# Patient Record
Sex: Female | Born: 1965 | Race: Black or African American | Hispanic: No | Marital: Single | Smoking: Never smoker
Health system: Southern US, Community
[De-identification: ages and names within clinical notes are randomized; demographics above are authoritative.]

---

## 2000-09-03 ENCOUNTER — Emergency Department (HOSPITAL_COMMUNITY): Admission: EM | Admit: 2000-09-03 | Discharge: 2000-09-03 | Payer: Self-pay | Admitting: Emergency Medicine

## 2004-10-29 ENCOUNTER — Emergency Department (HOSPITAL_COMMUNITY): Admission: EM | Admit: 2004-10-29 | Discharge: 2004-10-29 | Payer: Self-pay | Admitting: Family Medicine

## 2006-04-17 ENCOUNTER — Emergency Department (HOSPITAL_COMMUNITY): Admission: EM | Admit: 2006-04-17 | Discharge: 2006-04-17 | Payer: Self-pay | Admitting: Emergency Medicine

## 2006-04-26 ENCOUNTER — Emergency Department (HOSPITAL_COMMUNITY): Admission: EM | Admit: 2006-04-26 | Discharge: 2006-04-26 | Payer: Self-pay | Admitting: Emergency Medicine

## 2008-06-16 ENCOUNTER — Emergency Department (HOSPITAL_COMMUNITY): Admission: EM | Admit: 2008-06-16 | Discharge: 2008-06-16 | Payer: Self-pay | Admitting: Family Medicine

## 2008-09-16 ENCOUNTER — Ambulatory Visit: Payer: Self-pay | Admitting: Internal Medicine

## 2008-09-17 ENCOUNTER — Ambulatory Visit: Payer: Self-pay | Admitting: *Deleted

## 2008-09-26 ENCOUNTER — Ambulatory Visit (HOSPITAL_COMMUNITY): Admission: RE | Admit: 2008-09-26 | Discharge: 2008-09-26 | Payer: Self-pay | Admitting: Family Medicine

## 2008-11-18 ENCOUNTER — Ambulatory Visit: Payer: Self-pay | Admitting: Family Medicine

## 2008-11-18 LAB — CONVERTED CEMR LAB
ALT: 16 units/L (ref 0–35)
BUN: 15 mg/dL (ref 6–23)
Basophils Absolute: 0 10*3/uL (ref 0.0–0.1)
Basophils Relative: 0 % (ref 0–1)
CO2: 23 meq/L (ref 19–32)
Cholesterol: 171 mg/dL (ref 0–200)
Creatinine, Ser: 0.5 mg/dL (ref 0.40–1.20)
Eosinophils Relative: 1 % (ref 0–5)
HCT: 35.5 % — ABNORMAL LOW (ref 36.0–46.0)
HDL: 53 mg/dL (ref >39)
Hemoglobin: 11.1 g/dL — ABNORMAL LOW (ref 12.0–15.0)
Lymphocytes Relative: 49 % — ABNORMAL HIGH (ref 12–46)
MCHC: 31.3 g/dL (ref 30.0–36.0)
Monocytes Absolute: 0.4 10*3/uL (ref 0.1–1.0)
Monocytes Relative: 8 % (ref 3–12)
RDW: 13.9 % (ref 11.5–15.5)
Total Bilirubin: 1.1 mg/dL (ref 0.3–1.2)
Total CHOL/HDL Ratio: 3.2
VLDL: 11 mg/dL (ref 0–40)

## 2009-02-05 ENCOUNTER — Encounter (INDEPENDENT_AMBULATORY_CARE_PROVIDER_SITE_OTHER): Payer: Self-pay | Admitting: Family Medicine

## 2009-02-05 ENCOUNTER — Ambulatory Visit: Payer: Self-pay | Admitting: Family Medicine

## 2009-02-05 LAB — CONVERTED CEMR LAB: Chlamydia, DNA Probe: NEGATIVE

## 2009-02-20 ENCOUNTER — Emergency Department (HOSPITAL_COMMUNITY): Admission: EM | Admit: 2009-02-20 | Discharge: 2009-02-20 | Payer: Self-pay | Admitting: Emergency Medicine

## 2010-04-17 ENCOUNTER — Emergency Department (HOSPITAL_COMMUNITY): Admission: EM | Admit: 2010-04-17 | Discharge: 2010-04-17 | Payer: Self-pay | Admitting: Emergency Medicine

## 2010-07-29 ENCOUNTER — Ambulatory Visit: Payer: Self-pay | Admitting: Family Medicine

## 2010-07-29 LAB — CONVERTED CEMR LAB
BUN: 10 mg/dL (ref 6–23)
Basophils Absolute: 0 10*3/uL (ref 0.0–0.1)
Basophils Relative: 0 % (ref 0–1)
Calcium: 9.7 mg/dL (ref 8.4–10.5)
Creatinine, Ser: 0.63 mg/dL (ref 0.40–1.20)
Eosinophils Absolute: 0.1 10*3/uL (ref 0.0–0.7)
Eosinophils Relative: 2 % (ref 0–5)
HCT: 33.4 % — ABNORMAL LOW (ref 36.0–46.0)
Hemoglobin: 10.8 g/dL — ABNORMAL LOW (ref 12.0–15.0)
MCHC: 32.3 g/dL (ref 30.0–36.0)
MCV: 92.8 fL (ref 78.0–100.0)
Monocytes Absolute: 0.4 10*3/uL (ref 0.1–1.0)
Monocytes Relative: 6 % (ref 3–12)
Neutro Abs: 2.6 10*3/uL (ref 1.7–7.7)
RBC: 3.6 M/uL — ABNORMAL LOW (ref 3.87–5.11)
RDW: 13.1 % (ref 11.5–15.5)

## 2010-08-05 ENCOUNTER — Ambulatory Visit (HOSPITAL_COMMUNITY): Admission: RE | Admit: 2010-08-05 | Discharge: 2010-08-05 | Payer: Self-pay | Admitting: Family Medicine

## 2010-10-07 ENCOUNTER — Ambulatory Visit (HOSPITAL_COMMUNITY): Admission: RE | Admit: 2010-10-07 | Discharge: 2010-10-07 | Payer: Self-pay | Admitting: Family Medicine

## 2010-12-14 IMAGING — CR DG SHOULDER 2+V*L*
3 series · 3 of 3 positions shown · non-contrast
Comparison: 10/07/2010

CLINICAL DATA: Chronic shoulder pain

LEFT SHOULDER - 2+ VIEW

[w shoulder ap internal left]
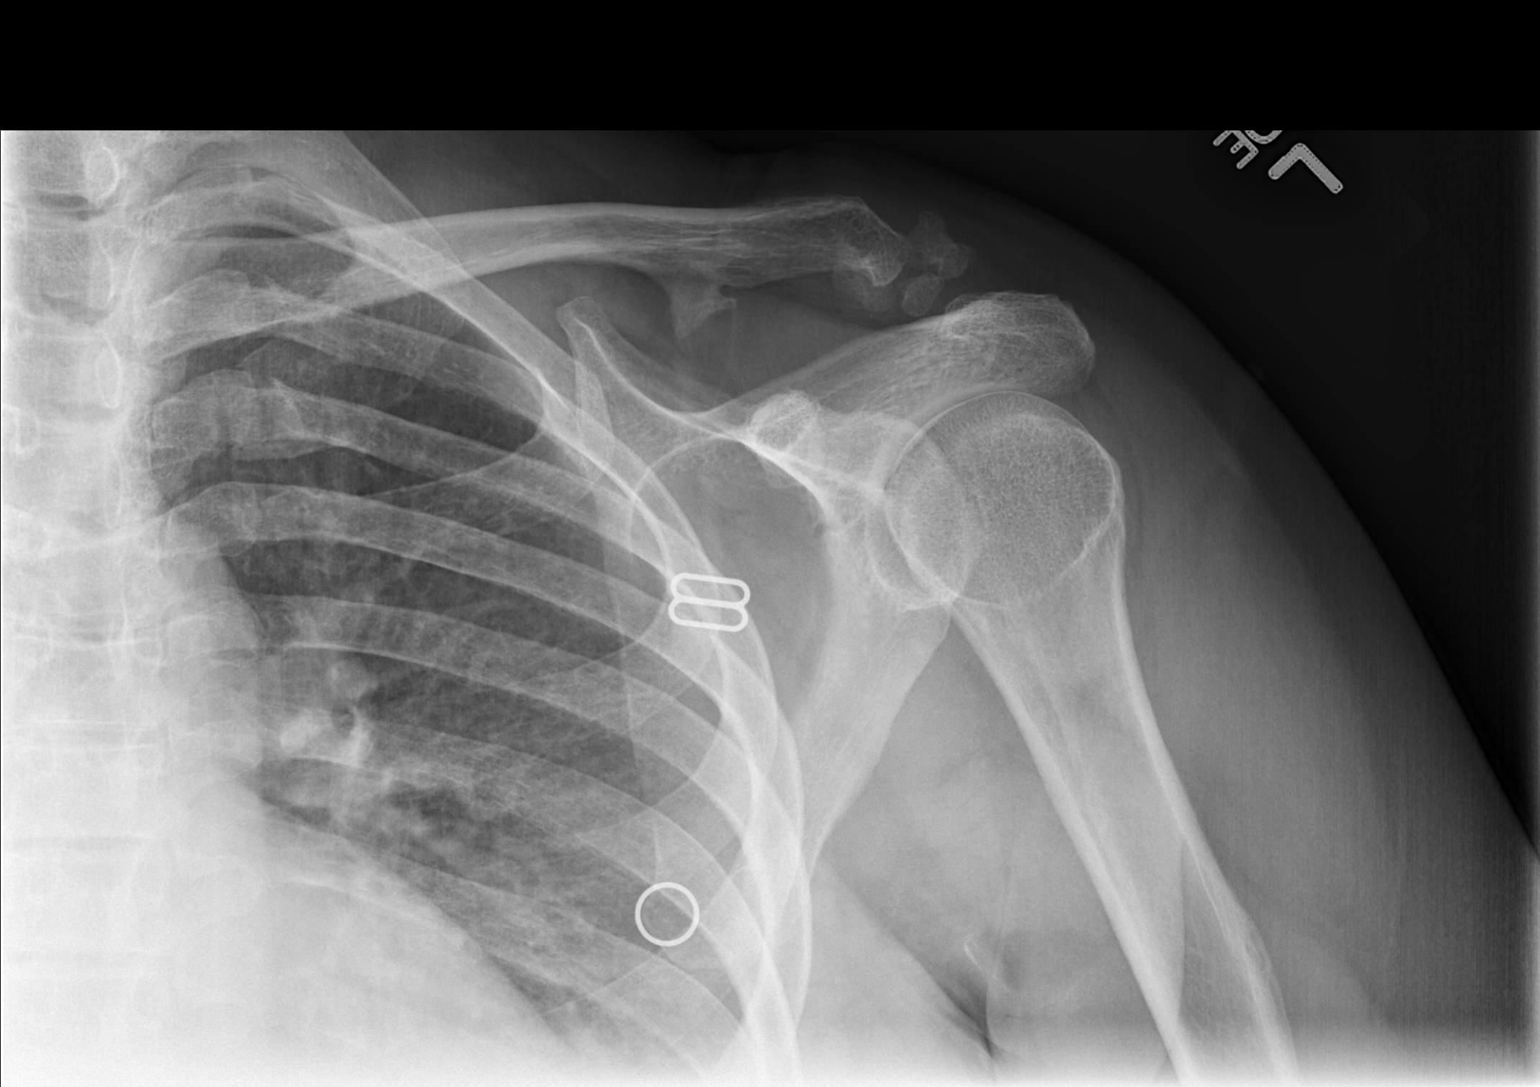

[w shoulder ap external left]
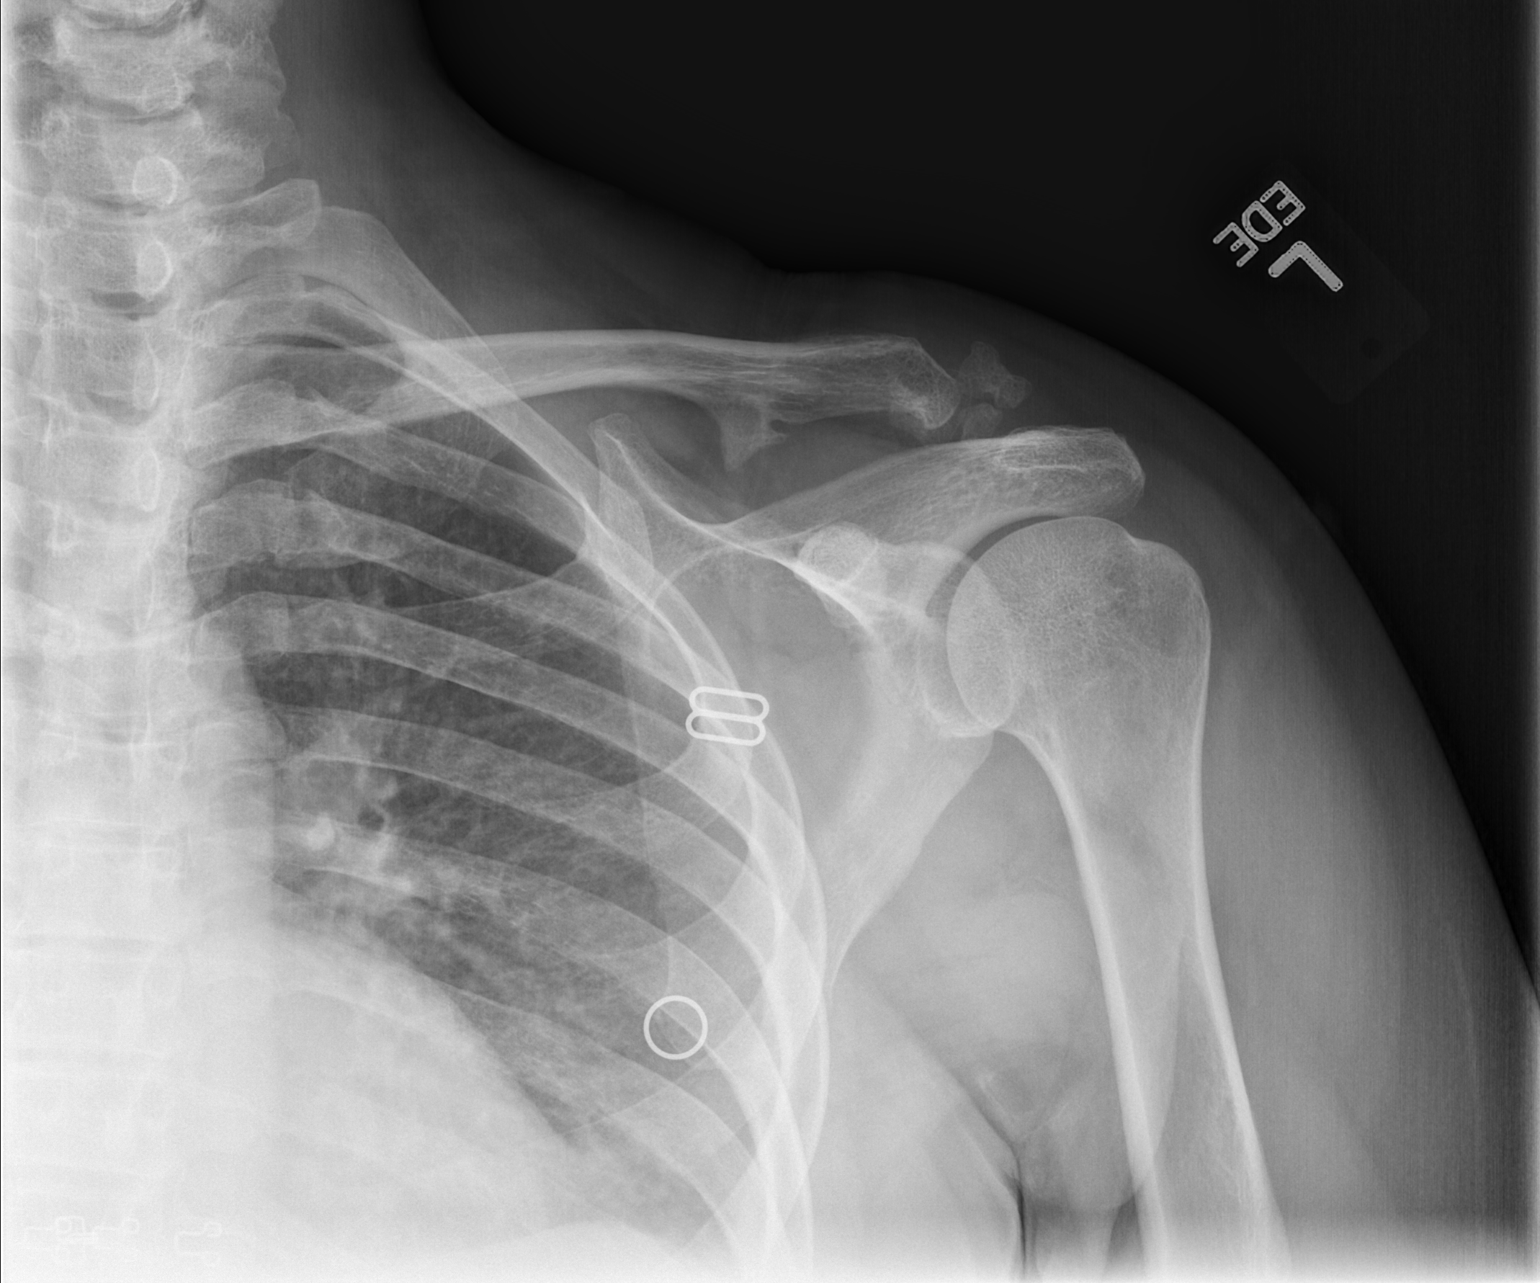

[w shoulder y view left]
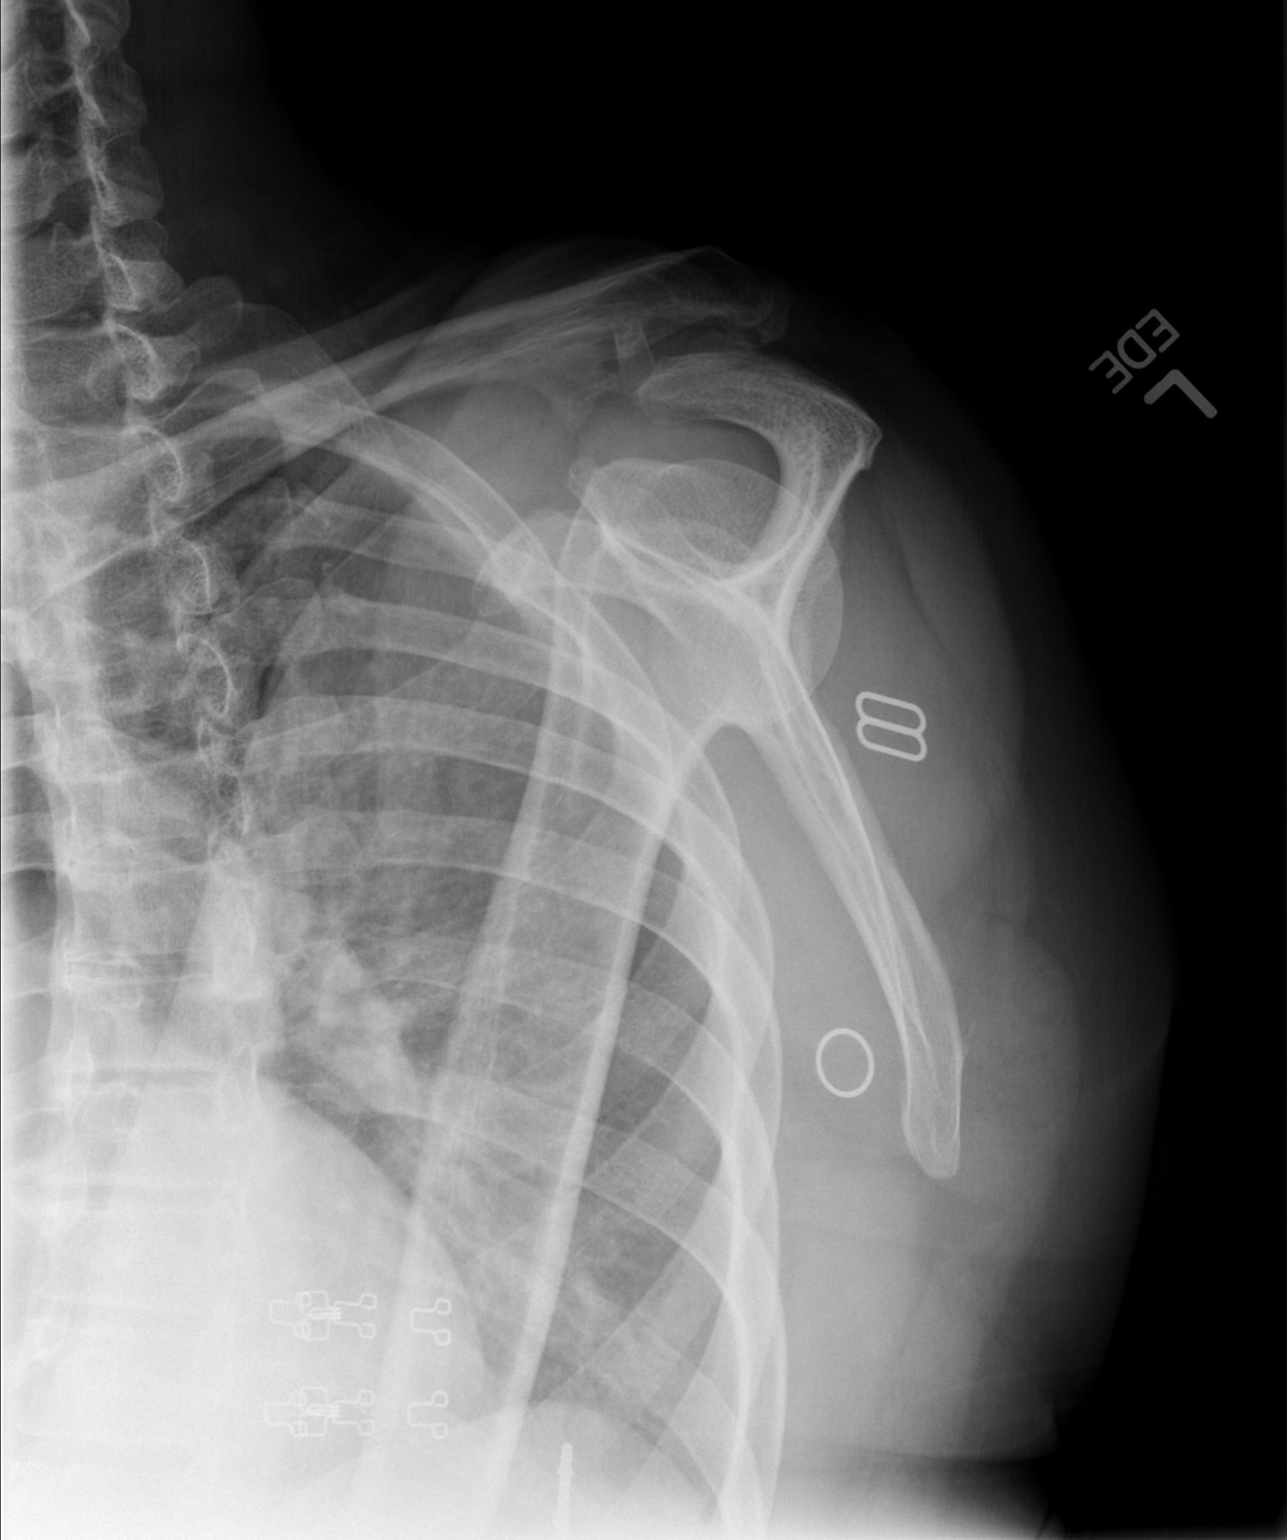

[3 of 3 positions shown; findings below may reference images not displayed]

FINDINGS: Chronic appearing separation of the acromio-clavicular
joint is identified.  There is widening of the coracoid-clavicular
interval.

Chronic fracture deformity involving the acromion is noted.

No acute fractures or dislocations noted.  The glenohumeral joint
appears normal.
IMPRESSION: 1.  Chronic appearing dislocation of the acromioclavicular joint
with chronic appearing fragmentation in the area of the acromian.

## 2011-03-10 LAB — GC/CHLAMYDIA PROBE AMP, GENITAL
Chlamydia, DNA Probe: NEGATIVE
GC Probe Amp, Genital: NEGATIVE

## 2011-03-10 LAB — WET PREP, GENITAL: Clue Cells Wet Prep HPF POC: NONE SEEN

## 2011-07-21 ENCOUNTER — Other Ambulatory Visit: Payer: Self-pay

## 2011-08-11 ENCOUNTER — Other Ambulatory Visit (HOSPITAL_COMMUNITY): Payer: Self-pay | Admitting: Family Medicine

## 2011-08-11 DIAGNOSIS — Z1231 Encounter for screening mammogram for malignant neoplasm of breast: Secondary | ICD-10-CM

## 2011-08-19 ENCOUNTER — Ambulatory Visit (HOSPITAL_COMMUNITY): Payer: Self-pay | Attending: Family Medicine

## 2011-08-22 ENCOUNTER — Ambulatory Visit (HOSPITAL_COMMUNITY): Payer: Self-pay

## 2011-09-05 ENCOUNTER — Ambulatory Visit: Payer: Self-pay | Attending: Family Medicine | Admitting: Rehabilitative and Restorative Service Providers"

## 2012-01-16 ENCOUNTER — Other Ambulatory Visit: Payer: Self-pay | Admitting: Family Medicine

## 2013-08-18 ENCOUNTER — Emergency Department (HOSPITAL_COMMUNITY)
Admission: EM | Admit: 2013-08-18 | Discharge: 2013-08-18 | Disposition: A | Discharge status: Home / Self Care | Payer: Self-pay | Attending: Emergency Medicine | Admitting: Emergency Medicine

## 2013-08-18 ENCOUNTER — Encounter (HOSPITAL_COMMUNITY): Payer: Self-pay

## 2013-08-18 DIAGNOSIS — Z3202 Encounter for pregnancy test, result negative: Secondary | ICD-10-CM | POA: Insufficient documentation

## 2013-08-18 DIAGNOSIS — Z9851 Tubal ligation status: Secondary | ICD-10-CM | POA: Insufficient documentation

## 2013-08-18 DIAGNOSIS — N946 Dysmenorrhea, unspecified: Secondary | ICD-10-CM | POA: Insufficient documentation

## 2013-08-18 LAB — COMPREHENSIVE METABOLIC PANEL
CO2: 26 mEq/L (ref 19–32)
Calcium: 9.6 mg/dL (ref 8.4–10.5)
Creatinine, Ser: 0.84 mg/dL (ref 0.50–1.10)
GFR calc Af Amer: >90 mL/min (ref >90)
GFR calc non Af Amer: 83 mL/min — ABNORMAL LOW (ref >90)
Glucose, Bld: 71 mg/dL (ref 70–99)
Sodium: 138 mEq/L (ref 135–145)
Total Protein: 7.2 g/dL (ref 6.0–8.3)

## 2013-08-18 LAB — URINALYSIS, ROUTINE W REFLEX MICROSCOPIC
Glucose, UA: NEGATIVE mg/dL
Leukocytes, UA: NEGATIVE
Protein, ur: NEGATIVE mg/dL
Specific Gravity, Urine: 1.025 (ref 1.005–1.030)
Urobilinogen, UA: 0.2 mg/dL (ref 0.0–1.0)

## 2013-08-18 LAB — CBC WITH DIFFERENTIAL/PLATELET
HCT: 35.6 % — ABNORMAL LOW (ref 36.0–46.0)
Hemoglobin: 11.8 g/dL — ABNORMAL LOW (ref 12.0–15.0)
Lymphocytes Relative: 42 % (ref 12–46)
Lymphs Abs: 2.9 10*3/uL (ref 0.7–4.0)
Monocytes Absolute: 0.5 10*3/uL (ref 0.1–1.0)
Monocytes Relative: 8 % (ref 3–12)
Neutro Abs: 3 10*3/uL (ref 1.7–7.7)
Neutrophils Relative %: 44 % (ref 43–77)
RBC: 3.72 MIL/uL — ABNORMAL LOW (ref 3.87–5.11)

## 2013-08-18 LAB — WET PREP, GENITAL
Trich, Wet Prep: NONE SEEN
Yeast Wet Prep HPF POC: NONE SEEN

## 2013-08-18 LAB — URINE MICROSCOPIC-ADD ON

## 2013-08-18 LAB — HIV ANTIBODY (ROUTINE TESTING W REFLEX): HIV: NONREACTIVE

## 2013-08-18 LAB — TYPE AND SCREEN: ABO/RH(D): A POS

## 2013-08-18 LAB — PREGNANCY, URINE: Preg Test, Ur: NEGATIVE

## 2013-08-18 NOTE — ED Provider Notes (Signed)
CSN: [id number]     Arrival date & time 08/18/13  1431 History   First MD Initiated Contact with Patient 08/18/13 1525     Chief Complaint  Patient presents with  . Vaginal Bleeding   (Consider location/radiation/quality/duration/timing/severity/associated sxs/prior Treatment) HPI Comments: Janice Robertson is a 47 y.o. Female who presents for vaginal bleeding. She has been bleeding for 6 days, heavily, with clots. Her last menstrual period was August 4. She has had a bilateral tubal ligation. She has been sexually active without any protection. She believes that she has a pregnancy in her tubes, and a miscarriage. She denies weakness, dizziness, nausea, vomiting, fever, chills, back pain. There are no other known modifying factors.  Patient is a 47 y.o. female presenting with vaginal bleeding. The history is provided by the patient.  Vaginal Bleeding   History reviewed. No pertinent past medical history. History reviewed. No pertinent past surgical history. History reviewed. No pertinent family history. History  Substance Use Topics  . Smoking status: Never Smoker   . Smokeless tobacco: Not on file  . Alcohol Use: No   OB History   Grav Para Term Preterm Abortions TAB SAB Ect Mult Living                 Review of Systems  Genitourinary: Positive for vaginal bleeding.  All other systems reviewed and are negative.    Allergies  Review of patient's allergies indicates no known allergies.  Home Medications   Current Outpatient Rx  Name  Route  Sig  Dispense  Refill  . diphenhydramine-acetaminophen (TYLENOL PM) 25-500 MG TABS   Oral   Take 1 tablet by mouth at bedtime as needed.          BP 125/107  Pulse 74  Temp(Src) 98.5 F (36.9 C) (Oral)  Resp 20  SpO2 100%  LMP 08/12/2013 Physical Exam  Nursing note and vitals reviewed. Constitutional: She is oriented to person, place, and time. She appears well-developed and well-nourished.  HENT:  Head: Normocephalic and  atraumatic.  Eyes: Conjunctivae and EOM are normal. Pupils are equal, round, and reactive to light.  Neck: Normal range of motion and phonation normal. Neck supple.  Cardiovascular: Normal rate, regular rhythm and intact distal pulses.   Pulmonary/Chest: Effort normal and breath sounds normal. She exhibits no tenderness.  Abdominal: Soft. She exhibits no distension. There is no tenderness. There is no guarding.  Genitourinary:  Normal external female genitalia. Moderate amount of dark blood emanating from the cervical os. There is no cervical motion tenderness. Uterus is involuted. There is no adnexal mass. There is mild tenderness diffusely in the pelvis in the uterus and adnexa, bilaterally.  Musculoskeletal: Normal range of motion.  Neurological: She is alert and oriented to person, place, and time. She has normal strength. She exhibits normal muscle tone.  Skin: Skin is warm and dry.  Psychiatric: She has a normal mood and affect. Her behavior is normal. Judgment and thought content normal.    ED Course  Procedures (including critical care time) Labs Review Labs Reviewed  WET PREP, GENITAL - Abnormal; Notable for the following:    Clue Cells Wet Prep HPF POC FEW (*)    WBC, Wet Prep HPF POC FEW (*)    All other components within normal limits  URINALYSIS, ROUTINE W REFLEX MICROSCOPIC - Abnormal; Notable for the following:    Hgb urine dipstick LARGE (*)    All other components within normal limits  COMPREHENSIVE METABOLIC PANEL -  Abnormal; Notable for the following:    GFR calc non Af Amer 83 (*)    All other components within normal limits  CBC WITH DIFFERENTIAL - Abnormal; Notable for the following:    RBC 3.72 (*)    Hemoglobin 11.8 (*)    HCT 35.6 (*)    Platelets 410 (*)    All other components within normal limits  GC/CHLAMYDIA PROBE AMP  PREGNANCY, URINE  URINE MICROSCOPIC-ADD ON  RPR  HIV ANTIBODY (ROUTINE TESTING)  TYPE AND SCREEN  ABO/RH    MDM   1.  Dysmenorrhea    Dysmenorrhea; without pregnancy, anemia, signs of instability. Possibly early onset menopause symptoms. Though his resting by mouth vaginal infection. No clinical evidence for uterine fibroid. Ultrasound imaging, is not indicated. She is stable for discharge with outpatient management.  Nursing Notes Reviewed/ Care Coordinated, and agree without changes. Applicable Imaging Reviewed.  Interpretation of Laboratory Data incorporated into ED treatment   Plan: Home Medications- Tylenol prn pain; Home Treatments and Observation- Rest, fluids; return here if the recommended treatment, does not improve the symptoms; Recommended follow up- GYN f/u for definitive DX.     Flint Melter, MD 08/18/13 [phone]

## 2013-08-18 NOTE — ED Notes (Signed)
Patient states that she will be leaving in because she cant wait any more. I notified the RN

## 2013-08-18 NOTE — ED Notes (Signed)
She c/o vag. Bleeding "real heavy with some clots"; since Sep't. 15.  She is in no distress, and c/o mild lower abd. Pain L > R.  She denies fever or dysuria.  She states her period normally begins at the beginning of each month; but this one was delayed ~ 2 weeks.

## 2013-08-19 LAB — GC/CHLAMYDIA PROBE AMP
CT Probe RNA: NEGATIVE
GC Probe RNA: NEGATIVE

## 2015-07-01 ENCOUNTER — Emergency Department (HOSPITAL_COMMUNITY)
Admission: EM | Admit: 2015-07-01 | Discharge: 2015-07-01 | Disposition: A | Discharge status: Home / Self Care | Payer: Self-pay | Attending: Physician Assistant | Admitting: Physician Assistant

## 2015-07-01 ENCOUNTER — Encounter (HOSPITAL_COMMUNITY): Payer: Self-pay | Admitting: Emergency Medicine

## 2015-07-01 DIAGNOSIS — Z79899 Other long term (current) drug therapy: Secondary | ICD-10-CM | POA: Insufficient documentation

## 2015-07-01 DIAGNOSIS — M545 Low back pain, unspecified: Secondary | ICD-10-CM

## 2015-07-01 MED ORDER — CYCLOBENZAPRINE HCL 10 MG PO TABS: 10.0000 mg | ORAL_TABLET | Freq: Two times a day (BID) | ORAL | Status: AC | PRN

## 2015-07-01 MED ORDER — NAPROXEN 500 MG PO TABS
500.0000 mg | ORAL_TABLET | Freq: Once | ORAL | Status: AC
  Administered 2015-07-01: 500 mg via ORAL
  Filled 2015-07-01: qty 1

## 2015-07-01 MED ORDER — NAPROXEN 500 MG PO TABS: 500.0000 mg | ORAL_TABLET | Freq: Two times a day (BID) | ORAL | Status: AC

## 2015-07-01 MED ORDER — IBUPROFEN 800 MG PO TABS
800.0000 mg | ORAL_TABLET | Freq: Once | ORAL | Status: AC
  Administered 2015-07-01: 800 mg via ORAL
  Filled 2015-07-01: qty 1

## 2015-07-01 NOTE — Discharge Instructions (Signed)
1. Medications: flexeril, naproxyn, usual home medications 2. Treatment: rest, drink plenty of fluids, gentle stretching as discussed, alternate ice and heat 3. Follow Up: Please followup with your primary doctor in 3 days for discussion of your diagnoses and further evaluation after today's visit; if you do not have a primary care doctor use the resource guide provided to find one;  Return to the ER for worsening back pain, difficulty walking, loss of bowel or bladder control or other concerning symptoms     Back Exercises Back exercises help treat and prevent back injuries. The goal of back exercises is to increase the strength of your abdominal and back muscles and the flexibility of your back. These exercises should be started when you no longer have back pain. Back exercises include:  Pelvic Tilt. Lie on your back with your knees bent. Tilt your pelvis until the lower part of your back is against the floor. Hold this position 5 to 10 sec and repeat 5 to 10 times.  Knee to Chest. Pull first 1 knee up against your chest and hold for 20 to 30 seconds, repeat this with the other knee, and then both knees. This may be done with the other leg straight or bent, whichever feels better.  Sit-Ups or Curl-Ups. Bend your knees 90 degrees. Start with tilting your pelvis, and do a partial, slow sit-up, lifting your trunk only 30 to 45 degrees off the floor. Take at least 2 to 3 seconds for each sit-up. Do not do sit-ups with your knees out straight. If partial sit-ups are difficult, simply do the above but with only tightening your abdominal muscles and holding it as directed.  Hip-Lift. Lie on your back with your knees flexed 90 degrees. Push down with your feet and shoulders as you raise your hips a couple inches off the floor; hold for 10 seconds, repeat 5 to 10 times.  Back arches. Lie on your stomach, propping yourself up on bent elbows. Slowly press on your hands, causing an arch in your low back.  Repeat 3 to 5 times. Any initial stiffness and discomfort should lessen with repetition over time.  Shoulder-Lifts. Lie face down with arms beside your body. Keep hips and torso pressed to floor as you slowly lift your head and shoulders off the floor. Do not overdo your exercises, especially in the beginning. Exercises may cause you some mild back discomfort which lasts for a few minutes; however, if the pain is more severe, or lasts for more than 15 minutes, do not continue exercises until you see your caregiver. Improvement with exercise therapy for back problems is slow.  See your caregivers for assistance with developing a proper back exercise program. Document Released: 12/22/2004 Document Revised: 02/06/2012 Document Reviewed: 09/15/2011 [facility] Patient Information 2015 [place], [place]. This information is not intended to replace advice given to you by your health care provider. Make sure you discuss any questions you have with your health care provider.    Emergency Department Resource Guide 1) Find a Doctor and Pay Out of Pocket Although you won't have to find out who is covered by your insurance plan, it is a good idea to ask around and get recommendations. You will then need to call the office and see if the doctor you have chosen will accept you as a new patient and what types of options they offer for patients who are self-pay. Some doctors offer discounts or will set up payment plans for their patients who do not have  have insurance, but you will need to ask so you aren't surprised when you get to your appointment. ° °2) Contact Your Local Health Department °Not all health departments have doctors that can see patients for sick visits, but many do, so it is worth a call to see if yours does. If you don't know where your local health department is, you can check in your phone book. The CDC also has a tool to help you locate your state's health department, and many state websites also have  listings of all of their local health departments. ° °3) Find a Walk-in Clinic °If your illness is not likely to be very severe or complicated, you may want to try a walk in clinic. These are popping up all over the country in pharmacies, drugstores, and shopping centers. They're usually staffed by nurse practitioners or physician assistants that have been trained to treat common illnesses and complaints. They're usually fairly quick and inexpensive. However, if you have serious medical issues or chronic medical problems, these are probably not your best option. ° °No Primary Care Doctor: °- Call [facility] at  832-8000 - they can help you locate a primary care doctor that  accepts your insurance, provides certain services, etc. °- Physician Referral Service- 1-800-533-3463 ° °Chronic Pain Problems: °Organization         Address  Phone   Notes  °[facility]  (336) 297-2271 Patients need to be referred by their primary care doctor.  ° °Medication Assistance: °Organization         Address  Phone   Notes  °[place] [facility] [address] °[place], [place] [address] (336) 641-8030 --Must be a resident of [place] °-- Must have NO insurance coverage whatsoever (no Medicaid/ Medicare, etc.) °-- The pt. MUST have a primary care doctor that directs their care regularly and follows them in the community °  °[facility]  (866) 331-1348   °[employer]  (888) 892-1162   ° °Agencies that provide inexpensive medical care: °Organization         Address  Phone   Notes  °[facility]  (336) 832-8035   °[facility]    (336) 832-7272   °[facility] [address] °[place], [place] [address] (336) 832-4777   °[facility] [address], °[place] (336) 271-4999   °[facility]    (336) 373-0678   °[facility]    (336) 272-1050   °[facility] ° [address], [place] Phone:  (336) 832-4444, Fax:  (336) 832-4440 Hours of Operation:  9 am - 6 pm, M-F.  Also accepts Medicaid/Medicare and self-pay.  °[facility] ° [address], [place] Phone: (336) 832-3150, Fax: (336) 832-3151. Hours of Operation:  8:30 am - 5:30 pm, M-F.  Also accepts Medicaid and self-pay.  °[facility] [place] [address], [place] Phone: (336) 878-6027   °[facility] [address], [place],  (336)723-1848, Ext. 123 Mondays & Thursdays: 7-9 AM.  First 15 patients are seen on a first come, first serve basis. °  ° °Medicaid-accepting [place] Providers: ° °Organization         Address  Phone   Notes  °[facility] [address],  (336) 641-2100 Also accepts self-pay patients.  °[facility] [address], [place] ° (336) 856-9996   °[facility] [address], [place] (336) 288-8857   °[facility] [address] [place] [address], [place] (336) 299-7000   °Veita Bland [address], [place]  ° (336) 373-1557 Only accepts [place] Medicaid patients after they have their name applied to their card.  ° °Self-Pay (no insurance) in [place]: ° °Organization         Address  Phone   Notes  °Sickle Cell Patients, [facility] [address], [place] (336) 832-1970   °[facility] [address], [place] (336) 832-4400   ° [facility] ° [address], [place] (336) 992-4800   °[facility]/Dr. Osei-Bonsu ° [address] [place] [address], [place] or [address], [place] (336) 841-8500 Phone number for both [place] and [place] locations is the same.  °[facility] [address], [place] (336) 299-0000   °[facility] [address] [place] [address],  [place] or [address] (336) 852-7530 °(336) 878-2260   °[facility] [address], [place] (336) 350-1642, phone; (336) 294-5005, fax Sees patients 1st and 3rd Saturday of every month.  Must not qualify for public or private insurance (i.e. Medicaid, Medicare, [place] Health Choice, Veterans' Benefits) • Household income should be no more than 200% of the poverty level •The clinic cannot treat you if you are pregnant or think you are pregnant • Sexually transmitted diseases are not treated at the clinic.  ° ° °Dental Care: °Organization         Address  Phone  Notes  °[facility] [address], [place] (336) 641-6152 Accepts children up to age 21 who are enrolled in Medicaid or [place] Health Choice; pregnant women with a Medicaid card; and children who have applied for Medicaid or [place] Health Choice, but were declined, whose parents can pay a reduced fee at time of service.  °[facility] [place]  [address], [place] (336) 641-7733 Accepts children up to age 21 who are enrolled in Medicaid or [place] Health Choice; pregnant women with a Medicaid card; and children who have applied for Medicaid or  Health Choice, but were declined, whose parents can pay a reduced fee at time of service.  °[facility] ° [address], [place] (336) 641-4533 Patients are seen by appointment only. Walk-ins are not accepted. [facility] will see patients 18 years of age and older. °Monday - Tuesday (8am-5pm) °Most Wednesdays (8:30-5pm) °$30 per visit, cash only  °[facility] ° [address], [place] (336) 641-4533 Patients are seen by appointment only. Walk-ins are not accepted. [facility] will see patients 18 years of age and older. °One Wednesday Evening (Monthly: Volunteer Based).  $30 per visit, cash only  °[facility]  (919) 537-3737 for adults; Children under age 4, call Graduate Pediatric Dentistry at (919) [phone]. Children aged 4-14, please call (919) [phone] to request a pediatric application. ° Dental services are provided in all areas of dental care including fillings, crowns and bridges, complete and partial dentures, implants, gum treatment, root canals, and extractions. Preventive care is also provided. Treatment is provided to both adults and children. °Patients are selected   via a lottery and there is often a waiting list. °  °[facility] [address], °[place] ° (336) 763-8833 [email or web address] °  °[facility] [address], [place], [place] (336)723-1848, Ext. 123 Second and Fourth Thursday of each month, opens at 6:30 AM; Clinic ends at 9 AM.  Patients are seen on a first-come first-served basis, and a limited number are seen during each clinic.  ° °[facility] ° [address], [place], [place] (336) 723-7904   Eligibility Requirements °You must have lived in [place], [place], or [place] counties for at least the last three months. °  You cannot be eligible for state or federal sponsored healthcare insurance, including Veterans Administration, Medicaid, or Medicare. °  You generally cannot be eligible for healthcare insurance through your employer.  °  How to apply: °Eligibility screenings are held every Tuesday and Wednesday afternoon from 1:00 pm until 4:00 pm. You do not need an appointment for the interview!  °[facility] [address], [place], [place] 336-631-2330   °[facility] [facility]  336-342-8273   °[facility]  336-703-3100   ° [facility]  336-570-6415   ° °Behavioral Health Resources in the Community: °Intensive Outpatient Programs °Organization         Address  Phone  Notes  °[facility] [address], [place], [place] 336-878-6098     °[facility] [address], [place], [place] 336-832-9800   °[facility] [address], [place], [place] ° 336-882-2125   °[facility] [address],  °[place], [place] 1-800-853-5163 or 336-641-4981   °Substance Abuse Resources °Organization         Address  Phone  Notes  °[facility]  336-882-2125   °[facility]  336-784-9470   °[facility]  336-285-9073   °[facility]  336-845-3988   °Residential & Outpatient Substance Abuse Program  1-800-659-3381   °Psychological Services °Organization         Address  Phone  Notes  °[facility]  336- 832-9600   °[facility]  336- 378-7881   °[facility] [address], [place] 1-800-853-5163 or 336-641-4981   ° °Mobile Crisis Teams °Organization         Address  Phone  Notes  °[facility], Mobile Crisis Care Unit  1-877-626-1772   °Assertive °[facility] ° [address] [place], [place] 336-834-9664   °Sharon DeEsch [address] °[place] 336-554-5454   ° °Self-Help/Support Groups °Organization         Address  Phone             Notes  °[facility] - variety of support groups  336- 373-1402 Call for more information  °Narcotics Anonymous (NA), [facility] [address], °[place]  2 meetings at this location  ° °Residential Treatment Programs °Organization         Address  Phone  Notes  °[facility] [address],    ° [place]  1-866-801-8205   °[facility] ° [address], [place], [place] 704-293-8524   °[facility] [address], [place] 336-845-3988 Admissions: 8am-3pm M-F  °[facility] [address]    °[place],  336-841-1104   °[facility] [address], [place], [place] 336-379-7146   °[facility] [address]  °[place],  [place] 336-285-9073   °[facility] - Intensive Outpatient [address], [place], [place] 336-852-3033   °[facility] [address]  °[place], [place] 1-877-615-2722 or 336-784-9470   °[facility] [address] [place], [place] 336-227-7417 Accepts Medicaid  °[facility] [address]  °[place] 1-800-659-3381 Substance Abuse/Addiction Treatment  ° °[place] Behavioral Health Resources °Organization         Address  Phone  Notes  °[facility]  (888) 581-9988   °Julie Brannon, PhD [address] [place], [place]   (336) 349-5553 or (336) 951-0000   ° Behavioral   [address] °[place], [place] (336) 349-4454   °[facility] [address], [place], [place] (336) 342-8316 Insurance/Medicaid/sponsorship through [facility]  °[facility] [address]                                    [place], [place] (336) 342-8316 Therapy/tele-psych/case  °[facility] [address]  ° [place], [place] (336) 349-2233    °Dr. Arfeen  (336) 349-4544   °[facility] [place]  [employer] [place] [facility] 1) [address], [place] °2) [address], [place] °3)  [address], [place] (336) 349-3220 °(336) 342-7768 ° °(336) 342-8140   °[place] [facility] (336) 342-1394 or (336) 342-3537 (After Hours)    ° ° ° °

## 2015-07-01 NOTE — ED Notes (Signed)
Per patient states back pain for 2 weeks since working in the yard-states pain has gotten worser

## 2015-07-01 NOTE — ED Provider Notes (Signed)
CSN: [id number]   [id number] date & time 07/01/15  0848 History   First MD Initiated Contact with Patient 07/01/15 [phone]     Chief Complaint  Patient presents with  . Back Pain     (Consider location/radiation/quality/duration/timing/severity/associated sxs/prior Treatment) The history is provided by the patient and medical records. No language interpreter was used.     Janice Robertson is a 49 y.o. female  with no major medical hx presents to the Emergency Department complaining of gradual, waxing and waning low back pain onset 5 weeks ago after working in the yard. Patient reports that her pain gets better and worse but never completely resolves. She denies history of back problems, back surgeries, numbness, weakness, gait disturbance, loss of bowel or bladder control, saddle anesthesia, radiation of pain. Patient has taken a friend's hydrocodone and has been taking large amounts of Tylenol PM. She reports this does not pain but does make her go to sleep. Bending and moving makes her pain worse and nothing seems to make it better.     History reviewed. No pertinent past medical history. History reviewed. No pertinent past surgical history. No family history on file. History  Substance Use Topics  . Smoking status: Never Smoker   . Smokeless tobacco: Not on file  . Alcohol Use: No   OB History    No data available     Review of Systems  Constitutional: Negative for fever and fatigue.  Respiratory: Negative for chest tightness and shortness of breath.   Cardiovascular: Negative for chest pain.  Gastrointestinal: Negative for nausea, vomiting, abdominal pain and diarrhea.  Genitourinary: Negative for dysuria, urgency, frequency and hematuria.  Musculoskeletal: Positive for back pain. Negative for joint swelling, neck pain and neck stiffness.  Skin: Negative for rash.  Neurological: Negative for weakness, light-headedness, numbness and headaches.  All other systems reviewed and are  negative.     Allergies  Review of patient's allergies indicates no known allergies.  Home Medications   Prior to Admission medications   Medication Sig Start Date End Date Taking? Authorizing Provider  Cyanocobalamin (VITAMIN B-12 PO) Take 1 tablet by mouth daily.   Yes Historical Provider, MD  diphenhydramine-acetaminophen (TYLENOL PM) 25-500 MG TABS Take 2-3 tablets by mouth as needed (for pain).    Yes Historical Provider, MD  Omega-3 Fatty Acids (FISH OIL PO) Take 1 capsule by mouth daily.   Yes Historical Provider, MD  cyclobenzaprine (FLEXERIL) 10 MG tablet Take 1 tablet (10 mg total) by mouth 2 (two) times daily as needed for muscle spasms. 07/01/15   Jadia Capers, PA-C  naproxen (NAPROSYN) 500 MG tablet Take 1 tablet (500 mg total) by mouth 2 (two) times daily with a meal. 07/01/15   Jacqulyne Gladue, PA-C   BP 132/95 mmHg  Pulse 69  Temp(Src) 99 F (37.2 C) (Oral)  Resp 20  SpO2 100%  LMP 05/31/2015 Physical Exam  Constitutional: She appears well-developed and well-nourished. No distress.  HENT:  Head: Normocephalic and atraumatic.  Mouth/Throat: Oropharynx is clear and moist. No oropharyngeal exudate.  Eyes: Conjunctivae are normal.  Neck: Normal range of motion. Neck supple.  Full ROM without pain  Cardiovascular: Normal rate, regular rhythm, normal heart sounds and intact distal pulses.   No murmur heard. Pulmonary/Chest: Effort normal and breath sounds normal. No respiratory distress. She has no wheezes.  Abdominal: Soft. She exhibits no distension. There is no tenderness.  Musculoskeletal:  Full range of motion of the T-spine and L-spine No  tenderness to palpation of the spinous processes of the T-spine or L-spine Mild tenderness to palpation of the right paraspinous muscles of the L-spine and right SI joint.    Lymphadenopathy:    She has no cervical adenopathy.  Neurological: She is alert. She has normal reflexes.  Reflex Scores:      Bicep reflexes  are 2+ on the right side and 2+ on the left side.      Brachioradialis reflexes are 2+ on the right side and 2+ on the left side.      Patellar reflexes are 2+ on the right side and 2+ on the left side.      Achilles reflexes are 2+ on the right side and 2+ on the left side. Speech is clear and goal oriented, follows commands Normal 5/5 strength in upper and lower extremities bilaterally including dorsiflexion and plantar flexion, strong and equal grip strength Sensation normal to light and sharp touch Moves extremities without ataxia, coordination intact Normal gait Normal balance No Clonus  Skin: Skin is warm and dry. No rash noted. She is not diaphoretic. No erythema.  Psychiatric: She has a normal mood and affect. Her behavior is normal.  Nursing note and vitals reviewed.   ED Course  Procedures (including critical care time) Labs Review Labs Reviewed - No data to display  Imaging Review No results found.   EKG Interpretation None      MDM   Final diagnoses:  Right-sided low back pain without sciatica   F[place] Parkins [place] presents with back pain.  No neurological deficits and normal neuro exam.  Patient can walk but states is painful.  No loss of bowel or bladder control.  No concern for cauda equina.  No fever, night sweats, weight loss, h/o cancer, IVDU.  RICE protocol, antiinflammatory and muscle relaxer indicated and discussed with patient.   BP 132/95 mmHg  Pulse 69  Temp(Src) 99 F (37.2 C) (Oral)  Resp 20  SpO2 100%  LMP 05/31/2015    Dierdre Forth, PA-C 07/01/15 1610  Courteney Lyn Mackuen, MD 07/01/15 1655

## 2021-04-29 ENCOUNTER — Ambulatory Visit (HOSPITAL_COMMUNITY)
Admission: RE | Admit: 2021-04-29 | Discharge: 2021-04-29 | Disposition: A | Discharge status: Home / Self Care | Payer: Self-pay | Source: Ambulatory Visit | Attending: Internal Medicine | Admitting: Internal Medicine

## 2021-04-29 ENCOUNTER — Other Ambulatory Visit (HOSPITAL_COMMUNITY): Payer: Self-pay | Admitting: Occupational Medicine

## 2021-04-29 ENCOUNTER — Other Ambulatory Visit (HOSPITAL_COMMUNITY): Payer: Self-pay | Admitting: Internal Medicine

## 2021-04-29 ENCOUNTER — Other Ambulatory Visit: Payer: Self-pay

## 2021-04-29 DIAGNOSIS — R7611 Nonspecific reaction to tuberculin skin test without active tuberculosis: Secondary | ICD-10-CM

## 2021-07-06 IMAGING — DX DG CHEST 2V
2 series · 2 of 2 positions shown · non-contrast
Comparison: 06/16/2008

CLINICAL DATA: Positive TB test.

EXAM:
CHEST - 2 VIEW

[chest pa]
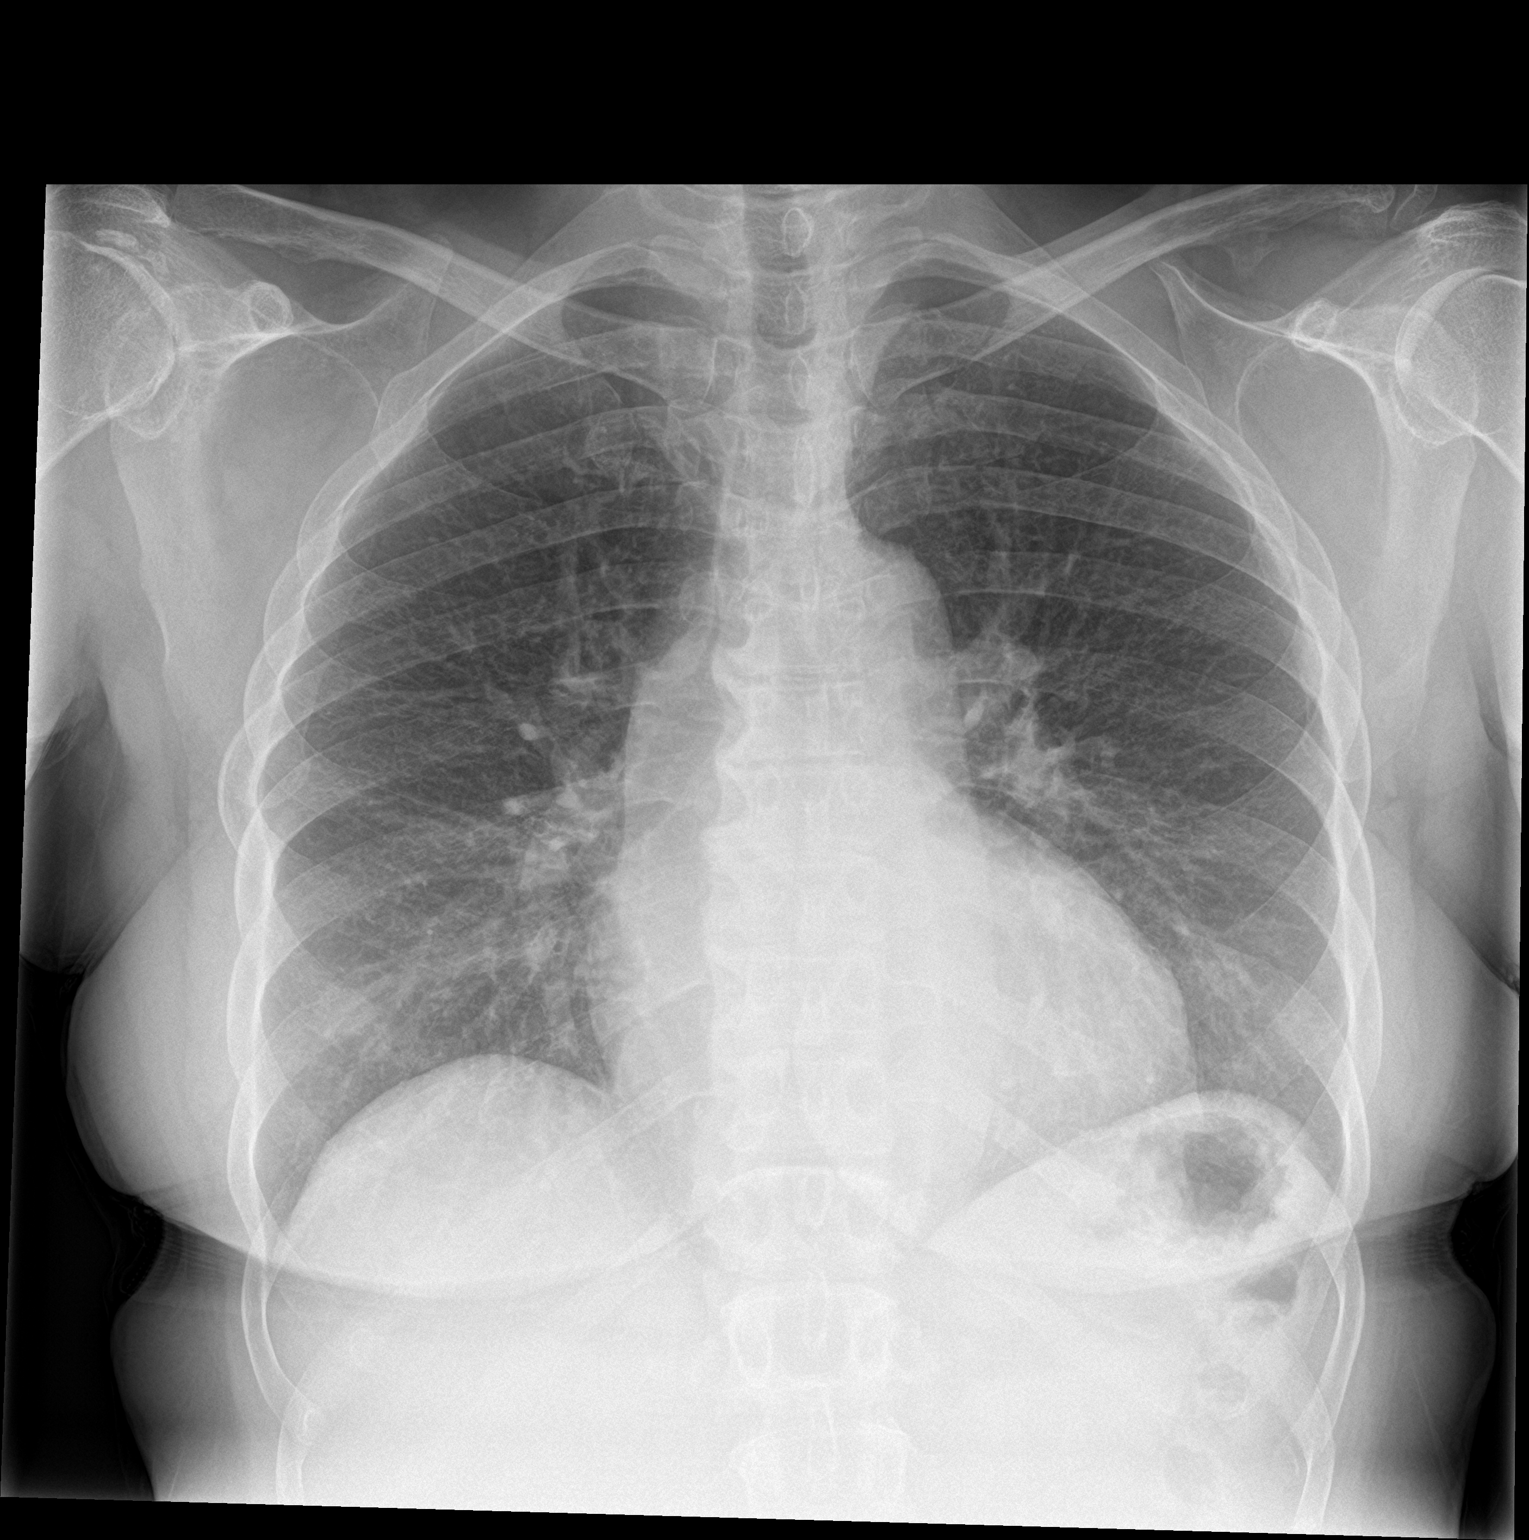

[chest lat]
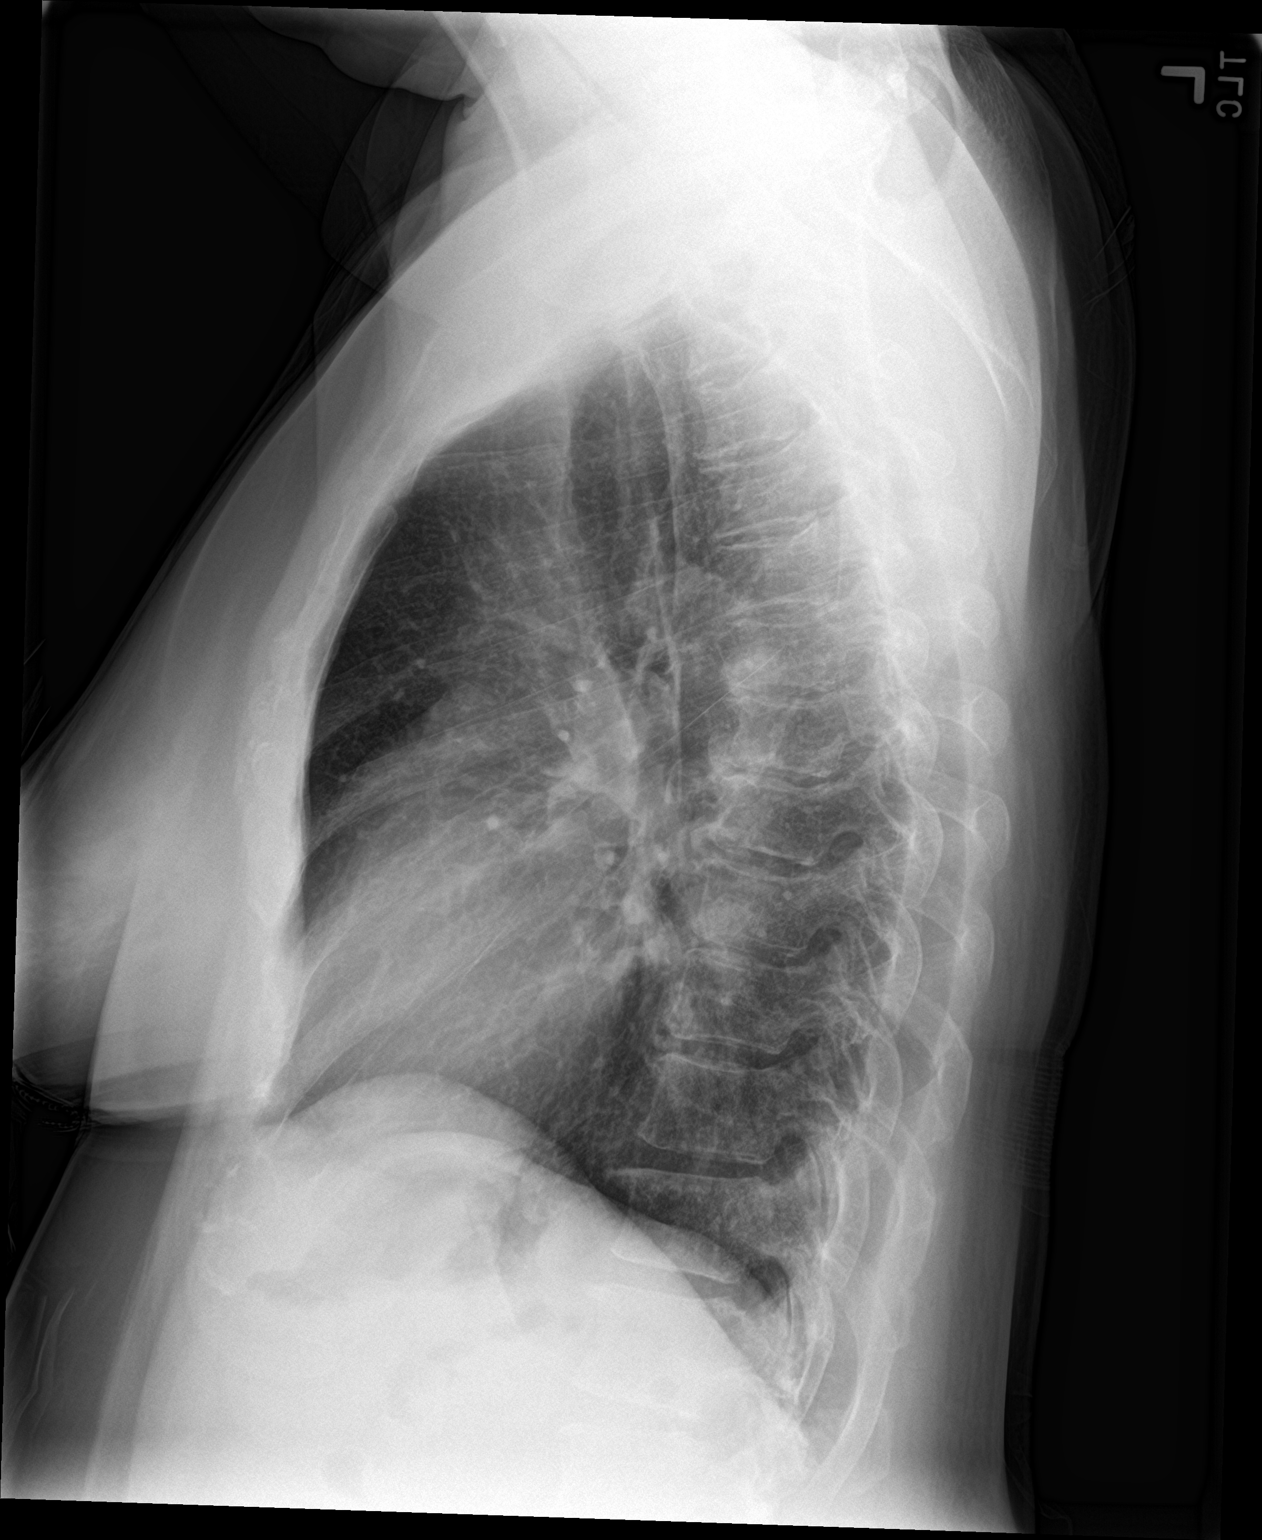

[2 of 2 positions shown; findings below may reference images not displayed]

FINDINGS: The cardiomediastinal silhouette is within normal limits. The lungs
are well inflated and clear. There is no evidence of pleural
effusion or pneumothorax. No acute osseous abnormality is
identified.
IMPRESSION: No active cardiopulmonary disease.

## 2021-12-23 ENCOUNTER — Ambulatory Visit: Payer: Self-pay | Admitting: Nurse Practitioner
